# Patient Record
Sex: Male | Born: 1950 | Race: Black or African American | Hispanic: No | Marital: Single | Smoking: Current every day smoker
Health system: Southern US, Community
[De-identification: ages and names within clinical notes are randomized; demographics above are authoritative.]

## PROBLEM LIST (undated history)

## (undated) DIAGNOSIS — I10 Essential (primary) hypertension: Secondary | ICD-10-CM

## (undated) DIAGNOSIS — K219 Gastro-esophageal reflux disease without esophagitis: Secondary | ICD-10-CM

## (undated) HISTORY — PX: PROSTATE SURGERY: SHX751

---

## 2019-08-28 ENCOUNTER — Emergency Department (HOSPITAL_COMMUNITY)
Admission: EM | Admit: 2019-08-28 | Discharge: 2019-08-28 | Disposition: A | Discharge status: Home / Self Care | Payer: Medicare Other | Attending: Emergency Medicine | Admitting: Emergency Medicine

## 2019-08-28 ENCOUNTER — Emergency Department (HOSPITAL_COMMUNITY): Payer: Medicare Other

## 2019-08-28 ENCOUNTER — Other Ambulatory Visit: Payer: Self-pay

## 2019-08-28 ENCOUNTER — Encounter (HOSPITAL_COMMUNITY): Payer: Self-pay | Admitting: *Deleted

## 2019-08-28 DIAGNOSIS — F17200 Nicotine dependence, unspecified, uncomplicated: Secondary | ICD-10-CM | POA: Diagnosis not present

## 2019-08-28 DIAGNOSIS — K2900 Acute gastritis without bleeding: Secondary | ICD-10-CM | POA: Insufficient documentation

## 2019-08-28 DIAGNOSIS — I10 Essential (primary) hypertension: Secondary | ICD-10-CM | POA: Diagnosis not present

## 2019-08-28 DIAGNOSIS — R1013 Epigastric pain: Secondary | ICD-10-CM | POA: Diagnosis present

## 2019-08-28 DIAGNOSIS — F1210 Cannabis abuse, uncomplicated: Secondary | ICD-10-CM | POA: Insufficient documentation

## 2019-08-28 DIAGNOSIS — Z79899 Other long term (current) drug therapy: Secondary | ICD-10-CM | POA: Diagnosis not present

## 2019-08-28 HISTORY — DX: Essential (primary) hypertension: I10

## 2019-08-28 HISTORY — DX: Gastro-esophageal reflux disease without esophagitis: K21.9

## 2019-08-28 LAB — CBC WITH DIFFERENTIAL/PLATELET
Abs Immature Granulocytes: 0.05 10*3/uL (ref 0.00–0.07)
Basophils Absolute: 0 10*3/uL (ref 0.0–0.1)
Basophils Relative: 0 %
Eosinophils Absolute: 0.2 10*3/uL (ref 0.0–0.5)
Eosinophils Relative: 2 %
HCT: 46 % (ref 39.0–52.0)
Hemoglobin: 15.1 g/dL (ref 13.0–17.0)
Immature Granulocytes: 1 %
Lymphocytes Relative: 15 %
Lymphs Abs: 1.6 10*3/uL (ref 0.7–4.0)
MCH: 28.1 pg (ref 26.0–34.0)
MCHC: 32.8 g/dL (ref 30.0–36.0)
MCV: 85.5 fL (ref 80.0–100.0)
Monocytes Absolute: 1.3 10*3/uL — ABNORMAL HIGH (ref 0.1–1.0)
Monocytes Relative: 13 %
Neutro Abs: 7.5 10*3/uL (ref 1.7–7.7)
Neutrophils Relative %: 69 %
Platelets: 217 10*3/uL (ref 150–400)
RBC: 5.38 MIL/uL (ref 4.22–5.81)
RDW: 14.6 % (ref 11.5–15.5)
WBC: 10.7 10*3/uL — ABNORMAL HIGH (ref 4.0–10.5)
nRBC: 0 % (ref 0.0–0.2)

## 2019-08-28 LAB — TROPONIN I (HIGH SENSITIVITY)
Troponin I (High Sensitivity): 24 ng/L — ABNORMAL HIGH (ref <18)
Troponin I (High Sensitivity): 20 ng/L — ABNORMAL HIGH (ref <18)

## 2019-08-28 LAB — COMPREHENSIVE METABOLIC PANEL
ALT: 17 U/L (ref 0–44)
AST: 30 U/L (ref 15–41)
Albumin: 3.6 g/dL (ref 3.5–5.0)
Alkaline Phosphatase: 44 U/L (ref 38–126)
Anion gap: 10 (ref 5–15)
BUN: 18 mg/dL (ref 8–23)
CO2: 25 mmol/L (ref 22–32)
Calcium: 9.3 mg/dL (ref 8.9–10.3)
Chloride: 100 mmol/L (ref 98–111)
Creatinine, Ser: 1.19 mg/dL (ref 0.61–1.24)
GFR calc Af Amer: >60 mL/min (ref >60)
GFR calc non Af Amer: >60 mL/min (ref >60)
Glucose, Bld: 102 mg/dL — ABNORMAL HIGH (ref 70–99)
Potassium: 3.9 mmol/L (ref 3.5–5.1)
Sodium: 135 mmol/L (ref 135–145)
Total Bilirubin: 1 mg/dL (ref 0.3–1.2)
Total Protein: 7.8 g/dL (ref 6.5–8.1)

## 2019-08-28 LAB — LIPASE, BLOOD: Lipase: 24 U/L (ref 11–51)

## 2019-08-28 MED ORDER — PANTOPRAZOLE SODIUM 40 MG PO TBEC: 40.0000 mg | DELAYED_RELEASE_TABLET | Freq: Every day | ORAL | 0 refills | Status: AC

## 2019-08-28 MED ORDER — OXYCODONE-ACETAMINOPHEN 5-325 MG PO TABS
2.0000 | ORAL_TABLET | Freq: Once | ORAL | Status: AC
  Administered 2019-08-28: 2 via ORAL
  Filled 2019-08-28: qty 2

## 2019-08-28 MED ORDER — PANTOPRAZOLE SODIUM 40 MG PO TBEC
40.0000 mg | DELAYED_RELEASE_TABLET | Freq: Once | ORAL | Status: AC
  Administered 2019-08-28: 06:00:00 40 mg via ORAL
  Filled 2019-08-28: qty 1

## 2019-08-28 MED ORDER — FAMOTIDINE 20 MG PO TABS
20.0000 mg | ORAL_TABLET | Freq: Once | ORAL | Status: AC
  Administered 2019-08-28: 20 mg via ORAL
  Filled 2019-08-28: qty 1

## 2019-08-28 MED ORDER — ALUM & MAG HYDROXIDE-SIMETH 200-200-20 MG/5ML PO SUSP
30.0000 mL | Freq: Once | ORAL | Status: AC
  Administered 2019-08-28: 30 mL via ORAL
  Filled 2019-08-28: qty 30

## 2019-08-28 MED ORDER — ACETAMINOPHEN 325 MG PO TABS
650.0000 mg | ORAL_TABLET | Freq: Once | ORAL | Status: AC
  Administered 2019-08-28: 650 mg via ORAL
  Filled 2019-08-28: qty 2

## 2019-08-28 NOTE — Discharge Instructions (Addendum)
It was our pleasure to provide your ER care today - we hope that you feel better.  Take protonix (acid blocker medication) as prescribed. You may also try pepcid or maalox as need for symptom relief.   Follow up with primary care doctor in the next 2-3 weeks.  If chest pain/discomfort, follow up with cardiologist in the next 1-2 weeks.   Return to ER if worse, new symptoms, fevers, trouble breathing, worsening or severe abdominal pain, recurrent or persistent chest pain, or other concern.

## 2019-08-28 NOTE — ED Triage Notes (Signed)
Pt reports pain in the upper abdomen that goes through his chest, was laying down at onset. C/o sharp pain. No sob, nausea, dizziness, non radiating. He took aspirin for the pain. Has not had time to fill rx for protonix.

## 2019-08-28 NOTE — ED Triage Notes (Signed)
Pt lives in [place] ED (Friday night) with epigastric pain,  dx with heartburn secondary to gastritis, given rx for protonix and has not gotten it filled. Pt reports he was lying down and had return of the same pain tonight. En route, 12 Lead unremarkable. Epigastric pain, radiates to the left side. Unchanged by position. 168/96, hr 64, 18 RR, 98% RA.

## 2019-08-28 NOTE — ED Provider Notes (Signed)
Signed out by Dr Clayborne Dana at [phone] that pt with gastritis, and that 1st trop was 'detectable', and that if repeat trop not increased from initial, to d/c to home.   Pt denies chest pain or discomfort. Abd soft nt. Delta trop is slightly decreased from initial.   Pt currently appears stable for d/c.  Rec pcp f/u. Return precautions provided.      Cathren Laine, MD 08/28/19 [phone]

## 2019-08-30 NOTE — ED Provider Notes (Signed)
[facility] DEPT Provider Note   CSN: [id number] Arrival date & time: 08/28/19  0502     History Chief Complaint  Patient presents with  . Abdominal Pain    Hunter Fletcher is a 69 y.o. male.   Abdominal Pain Pain location:  Epigastric Pain quality: aching and sharp   Pain radiates to:  Does not radiate Pain severity:  Mild Duration:  1 hour Timing:  Constant Chronicity:  New Context: not alcohol use and not recent illness        Past Medical History:  Diagnosis Date  . GERD (gastroesophageal reflux disease)   . Hypertension     There are no problems to display for this patient.   Past Surgical History:  Procedure Laterality Date  . PROSTATE SURGERY         No family history on file.  Social History   Tobacco Use  . Smoking status: Current Every Day Smoker  Substance Use Topics  . Alcohol use: Yes  . Drug use: Yes    Types: Marijuana    Home Medications Prior to Admission medications   Medication Sig Start Date End Date Taking? Authorizing Provider  amLODipine (NORVASC) 10 MG tablet Take 10 mg by mouth daily. 05/28/19  Yes [provider]  carvedilol (COREG) 6.25 MG tablet Take 6.25 mg by mouth daily. SEE NOTE! Pt is suppose to be taking 6.25mg  BID per pharmacy records. 06/11/19  Yes [provider]  NATURAL VITAMIN D-3 125 MCG (5000 UT) TABS Take 1 tablet by mouth daily. 05/28/19  Yes [provider]  pantoprazole (PROTONIX) 40 MG tablet Take 1 tablet (40 mg total) by mouth daily. 08/28/19   Lajean Saver, MD    Allergies    Patient has no known allergies.  Review of Systems   Review of Systems  Gastrointestinal: Positive for abdominal pain.  All other systems reviewed and are negative.   Physical Exam Updated Vital Signs BP (!) 153/80 (BP Location: Right Arm)   Pulse (!) 58   Temp 98.2 F (36.8 C)   Resp 18   Ht 6\' 1"  (1.854 m)   Wt 81.6 kg   SpO2 96%   BMI 23.75 kg/m   Physical  Exam Vitals and nursing note reviewed.  Constitutional:      Appearance: He is well-developed.  HENT:     Head: Normocephalic and atraumatic.  Cardiovascular:     Rate and Rhythm: Normal rate.  Pulmonary:     Effort: Pulmonary effort is normal. No respiratory distress.  Abdominal:     General: There is no distension.     Palpations: There is no shifting dullness or fluid wave.     Tenderness: There is no abdominal tenderness.  Musculoskeletal:        General: Normal range of motion.     Cervical back: Normal range of motion.  Skin:    General: Skin is warm and dry.  Neurological:     General: No focal deficit present.     Mental Status: He is alert.     Cranial Nerves: No cranial nerve deficit.     Motor: No weakness.     ED Results / Procedures / Treatments   Labs (all labs ordered are listed, but only abnormal results are displayed) Labs Reviewed  CBC WITH DIFFERENTIAL/PLATELET - Abnormal; Notable for the following components:      Result Value   WBC 10.7 (*)    Monocytes Absolute 1.3 (*)  All other components within normal limits  COMPREHENSIVE METABOLIC PANEL - Abnormal; Notable for the following components:   Glucose, Bld 102 (*)    All other components within normal limits  TROPONIN I (HIGH SENSITIVITY) - Abnormal; Notable for the following components:   Troponin I (High Sensitivity) 24 (*)    All other components within normal limits  TROPONIN I (HIGH SENSITIVITY) - Abnormal; Notable for the following components:   Troponin I (High Sensitivity) 20 (*)    All other components within normal limits  LIPASE, BLOOD    EKG EKG Interpretation  Date/Time:  Tuesday Aug 28 2019 05:17:46 EDT Ventricular Rate:  60 PR Interval:    QRS Duration: 106 QT Interval:  411 QTC Calculation: 411 R Axis:   32 Text Interpretation: Sinus rhythm Probable left atrial enlargement LVH with secondary repolarization abnormality No old tracing to compare Confirmed by Marily Memos  [phone]) on 08/28/2019 6:39:43 AM   Radiology No results found.  Procedures Procedures (including critical care time)  Medications Ordered in ED Medications  pantoprazole (PROTONIX) EC tablet 40 mg (40 mg Oral Given 08/28/19 0609)  famotidine (PEPCID) tablet 20 mg (20 mg Oral Given 08/28/19 0609)  alum & mag hydroxide-simeth (MAALOX/MYLANTA) 200-200-20 MG/5ML suspension 30 mL (30 mLs Oral Given 08/28/19 0609)  oxyCODONE-acetaminophen (PERCOCET/ROXICET) 5-325 MG per tablet 2 tablet (2 tablets Oral Given 08/28/19 0609)  acetaminophen (TYLENOL) tablet 650 mg (650 mg Oral Given 08/28/19 0957)    ED Course  I have reviewed the triage vital signs and the nursing notes.  Pertinent labs & imaging results that were available during my care of the patient were reviewed by me and considered in my medical decision making (see chart for details).    MDM Rules/Calculators/A&P                      Here with epigastric symptoms similar to previous episodes of gastritis.  Will treat for the same.  Also has multiple risk factors for coronary artery disease but no EKG changes and his first troponin is 19 so we will get a second troponin to make sure there is no delta change and make sure his symptoms improved. Care transferred pending repeat troponin and reevaluation for likely discharge.  Already has prescriptions at home.  Final Clinical Impression(s) / ED Diagnoses Final diagnoses:  Acute gastritis without hemorrhage, unspecified gastritis type  Epigastric pain    Rx / DC Orders ED Discharge Orders         Ordered    pantoprazole (PROTONIX) 40 MG tablet  Daily     08/28/19 0927           Marily Memos, MD 08/30/19 2312

## 2021-09-28 IMAGING — DX DG CHEST 1V PORT
1 series · 1 of 1 positions shown · non-contrast
Comparison: None.

CLINICAL DATA: The epigastric and chest pain

EXAM:
PORTABLE CHEST 1 VIEW

[chest ap]
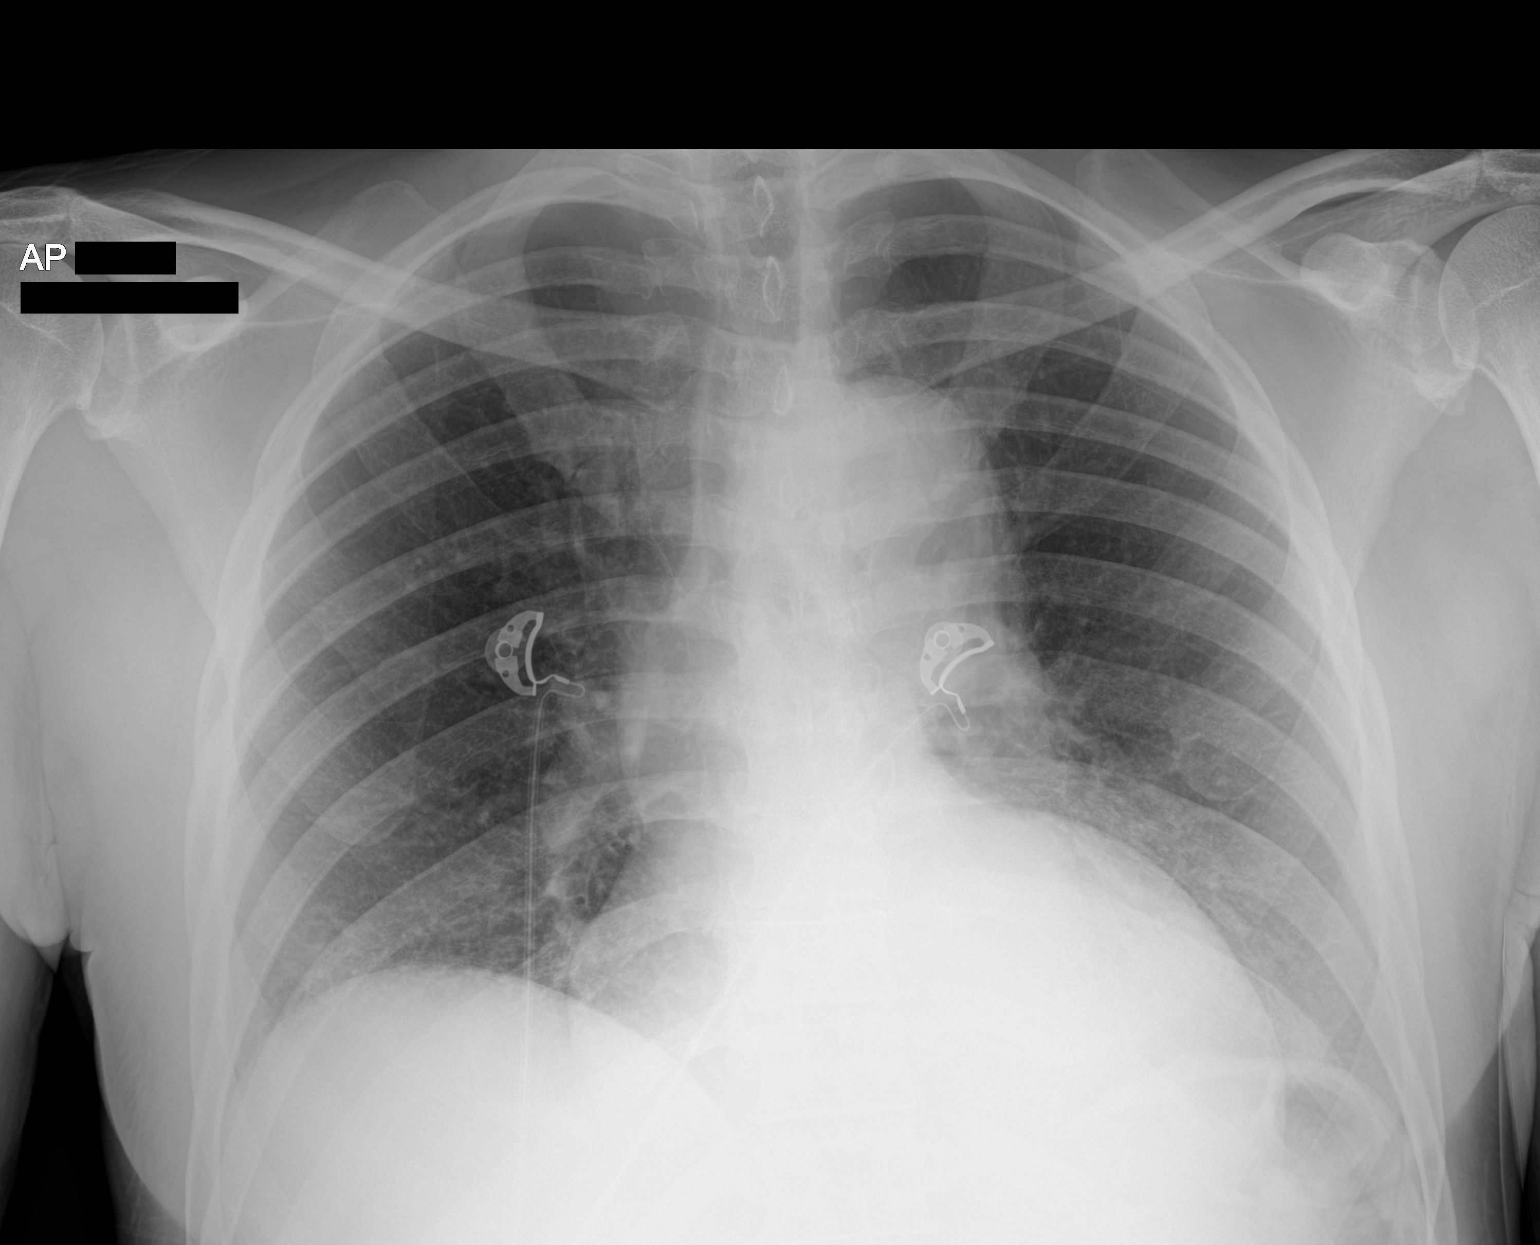

[1 of 1 positions shown; findings below may reference images not displayed]

FINDINGS: Borderline cardiomegaly. Fine interstitial density at the lung bases
in the setting of low lung volumes. There is no edema,
consolidation, effusion, or pneumothorax.
IMPRESSION: Mild reticular density at the bases, favor atelectasis.

## 2022-02-03 ENCOUNTER — Other Ambulatory Visit (HOSPITAL_COMMUNITY): Payer: Self-pay | Admitting: Nurse Practitioner

## 2022-02-03 DIAGNOSIS — I739 Peripheral vascular disease, unspecified: Secondary | ICD-10-CM

## 2022-02-08 ENCOUNTER — Ambulatory Visit (HOSPITAL_COMMUNITY)
Admission: RE | Admit: 2022-02-08 | Discharge: 2022-02-08 | Disposition: A | Discharge status: Home / Self Care | Payer: Medicare HMO | Source: Ambulatory Visit | Attending: Nurse Practitioner | Admitting: Nurse Practitioner

## 2022-02-08 DIAGNOSIS — I739 Peripheral vascular disease, unspecified: Secondary | ICD-10-CM | POA: Insufficient documentation

## 2022-02-08 NOTE — Progress Notes (Signed)
ABI has been completed.   Preliminary results in CV Proc.   Hunter Fletcher 02/08/2022 4:04 PM

## 2022-06-17 ENCOUNTER — Encounter: Payer: Self-pay | Admitting: Nurse Practitioner

## 2022-06-18 ENCOUNTER — Other Ambulatory Visit: Payer: Self-pay | Admitting: Nurse Practitioner

## 2022-06-18 DIAGNOSIS — S0990XA Unspecified injury of head, initial encounter: Secondary | ICD-10-CM
